# Patient Record
Sex: Male | Born: 1965 | Race: White | Hispanic: No | Smoking: Current some day smoker
Health system: Southern US, Community
[De-identification: ages and names within clinical notes are randomized; demographics above are authoritative.]

## PROBLEM LIST (undated history)

## (undated) DIAGNOSIS — K219 Gastro-esophageal reflux disease without esophagitis: Secondary | ICD-10-CM

## (undated) DIAGNOSIS — K449 Diaphragmatic hernia without obstruction or gangrene: Secondary | ICD-10-CM

## (undated) DIAGNOSIS — F1010 Alcohol abuse, uncomplicated: Secondary | ICD-10-CM

## (undated) DIAGNOSIS — F19939 Other psychoactive substance use, unspecified with withdrawal, unspecified: Secondary | ICD-10-CM

## (undated) HISTORY — PX: RHINOPLASTY FOR CLEFT LIP / PALATE: SUR1285

---

## 2013-07-27 ENCOUNTER — Encounter (HOSPITAL_BASED_OUTPATIENT_CLINIC_OR_DEPARTMENT_OTHER): Payer: Self-pay | Admitting: Emergency Medicine

## 2013-07-27 ENCOUNTER — Emergency Department (HOSPITAL_COMMUNITY): Payer: BC Managed Care – PPO

## 2013-07-27 ENCOUNTER — Emergency Department (HOSPITAL_BASED_OUTPATIENT_CLINIC_OR_DEPARTMENT_OTHER)
Admission: EM | Admit: 2013-07-27 | Discharge: 2013-07-27 | Disposition: A | Discharge status: Home / Self Care | Payer: BC Managed Care – PPO | Attending: Emergency Medicine | Admitting: Emergency Medicine

## 2013-07-27 DIAGNOSIS — F17200 Nicotine dependence, unspecified, uncomplicated: Secondary | ICD-10-CM | POA: Insufficient documentation

## 2013-07-27 DIAGNOSIS — K449 Diaphragmatic hernia without obstruction or gangrene: Secondary | ICD-10-CM | POA: Insufficient documentation

## 2013-07-27 DIAGNOSIS — K7689 Other specified diseases of liver: Secondary | ICD-10-CM | POA: Insufficient documentation

## 2013-07-27 DIAGNOSIS — K219 Gastro-esophageal reflux disease without esophagitis: Secondary | ICD-10-CM | POA: Insufficient documentation

## 2013-07-27 DIAGNOSIS — R1011 Right upper quadrant pain: Secondary | ICD-10-CM

## 2013-07-27 HISTORY — DX: Gastro-esophageal reflux disease without esophagitis: K21.9

## 2013-07-27 HISTORY — DX: Diaphragmatic hernia without obstruction or gangrene: K44.9

## 2013-07-27 LAB — CBC
Platelets: 199 10*3/uL (ref 150–400)
RBC: 4.61 MIL/uL (ref 4.22–5.81)
RDW: 13.4 % (ref 11.5–15.5)
WBC: 6 10*3/uL (ref 4.0–10.5)

## 2013-07-27 LAB — COMPREHENSIVE METABOLIC PANEL
ALT: 99 U/L — ABNORMAL HIGH (ref 0–53)
AST: 69 U/L — ABNORMAL HIGH (ref 0–37)
Albumin: 4.1 g/dL (ref 3.5–5.2)
Alkaline Phosphatase: 67 U/L (ref 39–117)
Chloride: 98 mEq/L (ref 96–112)
GFR calc Af Amer: >90 mL/min (ref >90)
Glucose, Bld: 111 mg/dL — ABNORMAL HIGH (ref 70–99)
Potassium: 4 mEq/L (ref 3.5–5.1)
Total Bilirubin: 0.7 mg/dL (ref 0.3–1.2)

## 2013-07-27 LAB — URINALYSIS, ROUTINE W REFLEX MICROSCOPIC
Bilirubin Urine: NEGATIVE
Glucose, UA: NEGATIVE mg/dL
Hgb urine dipstick: NEGATIVE
Ketones, ur: 15 mg/dL — AB
Leukocytes, UA: NEGATIVE
Protein, ur: NEGATIVE mg/dL
pH: 6 (ref 5.0–8.0)

## 2013-07-27 MED ORDER — OXYCODONE HCL 5 MG PO TABS
5.0000 mg | ORAL_TABLET | Freq: Once | ORAL | Status: AC
  Administered 2013-07-27: 5 mg via ORAL
  Filled 2013-07-27: qty 1

## 2013-07-27 MED ORDER — GI COCKTAIL ~~LOC~~
30.0000 mL | Freq: Once | ORAL | Status: AC
  Administered 2013-07-27: 30 mL via ORAL
  Filled 2013-07-27: qty 30

## 2013-07-27 NOTE — ED Provider Notes (Signed)
CSN: [id number]     Arrival date & time 07/27/13  1446 History  This chart was scribed for Dagmar Hait, MD by Dorothey Baseman, ED Scribe. This patient was seen in room MH10/MH10 and the patient's care was started at 3:00 PM.    Chief Complaint  Patient presents with  . Abdominal Pain   Patient is a 47 y.o. male presenting with abdominal pain. The history is provided by the patient. No language interpreter was used.  Abdominal Pain Pain location:  RUQ Pain quality: dull   Pain radiates to:  Does not radiate Pain severity:  Moderate Onset quality:  Sudden Timing:  Constant Chronicity:  New Context: alcohol use   Relieved by:  Heat and not moving Worsened by:  Deep breathing, movement and coughing Associated symptoms: no cough, no diarrhea, no fever, no hematuria, no nausea and no vomiting   Risk factors: alcohol abuse    HPI Comments: Jose Garner is a 47 y.o. male who presents to the Emergency Department complaining of a constant, dull, non-radiating pain to the RUQ of the abdomen, 6-7/10 currently, onset 2 days ago that is exacerbated with deep breathing, cough, and movement. He reports some associated abdominal bloating. He states that the pain is relieved with rest and reports applying heating pads to the area with relief. He denies any potential injury or trauma to the area. He denies fever, nausea, emesis, diarrhea, dysuria, hematuria. Patient reports a history of GERD and hiatal hernia, but states that his current symptoms do not feel similar. Patient reports that he is a current, every day drinker that is abusive in nature, but states that he has not had a drink in 2 days since the onset of symptoms and has been trying to taper his usage.   Past Medical History  Diagnosis Date  . GERD (gastroesophageal reflux disease)   . Hiatal hernia    No past surgical history on file. No family history on file. History  Substance Use Topics  . Smoking status: Not on file  .  Smokeless tobacco: Not on file  . Alcohol Use: Not on file    Review of Systems  Constitutional: Negative for fever.  Respiratory: Negative for cough.   Gastrointestinal: Positive for abdominal pain and abdominal distention. Negative for nausea, vomiting and diarrhea.  Genitourinary: Negative for hematuria.  All other systems reviewed and are negative.    Allergies  Review of patient's allergies indicates not on file.  Home Medications   Current Outpatient Rx  Name  Route  Sig  Dispense  Refill  . esomeprazole (NEXIUM) 40 MG capsule   Oral   Take 40 mg by mouth daily at 12 noon.         Marland Kitchen ibuprofen (ADVIL,MOTRIN) 200 MG tablet   Oral   Take 200 mg by mouth every 6 (six) hours as needed.         . propranolol (INDERAL) 10 MG tablet   Oral   Take 10 mg by mouth 3 (three) times daily.         . ranitidine (ZANTAC) 150 MG tablet   Oral   Take 150 mg by mouth 2 (two) times daily.          Triage Vitals: BP 155/107  Pulse 82  Temp(Src) 98.8 F (37.1 C)  Resp 16  Ht 5\' 8"  (1.727 m)  Wt 208 lb (94.348 kg)  BMI 31.63 kg/m2  SpO2 100%  Physical Exam  Nursing note and vitals reviewed.  Constitutional: He is oriented to person, place, and time. He appears well-developed and well-nourished. No distress.  HENT:  Head: Normocephalic and atraumatic.  Eyes: Conjunctivae are normal.  Neck: Normal range of motion. Neck supple.  Cardiovascular: Normal rate, regular rhythm and normal heart sounds.  Exam reveals no gallop and no friction rub.   No murmur heard. Pulmonary/Chest: Effort normal and breath sounds normal. No respiratory distress. He has no wheezes. He has no rales.  Abdominal: Soft. He exhibits no distension. There is tenderness. There is no rebound and no guarding.  Tenderness to palpation to the RUQ.   Genitourinary:  No CVA tenderness.   Musculoskeletal: Normal range of motion.  Neurological: He is alert and oriented to person, place, and time.  Skin:  Skin is warm and dry.  Psychiatric: He has a normal mood and affect. His behavior is normal.    ED Course  Procedures (including critical care time)  DIAGNOSTIC STUDIES: Oxygen Saturation is 100% on room air, normal by my interpretation.    COORDINATION OF CARE: 3:08 PM- Will order blood labs, UA, and a GI cocktail. Discussed treatment plan with patient at bedside and patient verbalized agreement.   4:13 PM- Patient reports that he is still feeling some discomfort after receiving the GI cocktail. Discussed that lab results indicate a slight elevation in liver enzymes, but that it is not cause for emergent concern at this time. Discussed that the patient may need to be transported to [facility] to receive an ultrasound to rule out possible issues with gallbladder. Patient reports that he will transport himself to [facility] today. Advised patient to return to the ED if there are any new or worsening symptoms, especially fever, nausea, or vomiting. Discussed treatment plan with patient at bedside and patient verbalized agreement.    Labs Review Labs Reviewed  COMPREHENSIVE METABOLIC PANEL - Abnormal; Notable for the following:    Glucose, Bld 111 (*)    AST 69 (*)    ALT 99 (*)    All other components within normal limits  URINALYSIS, ROUTINE W REFLEX MICROSCOPIC - Abnormal; Notable for the following:    Ketones, ur 15 (*)    All other components within normal limits  CBC  LIPASE, BLOOD   Imaging Review US Abdomen Complete  07/27/2013   CLINICAL DATA:  Right upper quadrant abdominal pain. Elevated liver function tests. Alcohol abuse.  EXAM: ULTRASOUND ABDOMEN COMPLETE  COMPARISON:  None.  FINDINGS: Gallbladder:  No gallstones or wall thickening visualized. No sonographic Murphy sign noted.  Common bile duct:  Diameter: 4  Liver:  Increased in echogenicity.  IVC:  No abnormality visualized.  Pancreas:  Poorly visualized due to overlying bowel gas.  Spleen:  Size and appearance within  normal limits.  Right Kidney:  Length: 11.2 cm. Echogenicity within normal limits. No mass or hydronephrosis visualized.  Left Kidney:  Length: 11.5 cm. Echogenicity within normal limits. No mass or hydronephrosis visualized.  Abdominal aorta:  No aneurysm visualized.  Other findings:  None.  IMPRESSION: No acute process or explanation for abdominal pain.  Hepatic steatosis.   Electronically Signed   By: Jeronimo Greaves M.D.   On: 07/27/2013 19:50    EKG Interpretation   None       MDM   1. Transaminitis   2. RUQ pain    47 year old male here with right upper quadrant pain. Began 2 days ago. His been constant since then. Began while he is using alcohol. Denies any alleviating  factors. Denies any fever, nausea, vomiting. No change with eating. This is worse with movement. He states he feels like his muscles are sore from doing a lot of sit-ups. Here patient's vitals are stable. He has mild right upper quadrant tenderness without guarding or rebound. His other abdominal pain. He has no CVA tenderness. His history does not match up with cholecystitis or biliary colic. With heavy alcohol use, concern for possible liver or pancreatic injury. We will check labs and give GI cocktail. Labs show mild transaminitis. Patient sent to [facility] for RUQ [place].  I personally performed the services described in this documentation, which was scribed in my presence. The recorded information has been reviewed and is accurate.      Dagmar Hait, MD 07/27/13 [phone]

## 2013-07-27 NOTE — ED Notes (Signed)
RUQ abdominal pain x 2 days.  No N/V.  No known fever.  Pt states he feels bloated.

## 2013-07-27 NOTE — ED Notes (Signed)
Pt was sent here from [facility] for an ultrasound of RUQ.  Elevated ast/alt's.  Pt c/o RUQ pain x 2 days.  States he is a heavy drinker and held off the etoh for 2 days with no pain relief.

## 2013-07-27 NOTE — ED Provider Notes (Signed)
CSN: [id number]     Arrival date & time 07/27/13  1446 History   First MD Initiated Contact with Jose Garner 07/27/13 1454     Chief Complaint  Jose Garner presents with  . Abdominal Pain    sent from [facility]   (Consider location/radiation/quality/duration/timing/severity/associated sxs/prior Treatment) Jose Garner is a 47 y.o. male presenting with abdominal pain. The history is provided by the Jose Garner.  Abdominal Pain Associated symptoms: no chest pain, no diarrhea, no nausea, no shortness of breath and no vomiting    Jose Garner was sent from [facility] for an ultrasound for evaluation right upper quadrant pain. He has had abdominal pain that is dull for the last 2-3 days. Began after eating. He is a heavy drinker. He is not interested in quitting. No nausea or vomiting. He's had some chronic diarrhea. No fevers. No decreased appetite. He has had some weight gain.  Past Medical History  Diagnosis Date  . GERD (gastroesophageal reflux disease)   . Hiatal hernia    Past Surgical History  Procedure Laterality Date  . Rhinoplasty for cleft lip / palate     History reviewed. No pertinent family history. History  Substance Use Topics  . Smoking status: Current Some Day Smoker    Types: Cigarettes  . Smokeless tobacco: Not on file  . Alcohol Use: Yes     Comment: Pt drinks heavily, 1.7L bottle of vodka, 1/5 of gin, 42 beers, 7 bottles of wine    Review of Systems  Constitutional: Negative for activity change and appetite change.  Eyes: Negative for pain.  Respiratory: Negative for chest tightness and shortness of breath.   Cardiovascular: Negative for chest pain and leg swelling.  Gastrointestinal: Positive for abdominal pain. Negative for nausea, vomiting and diarrhea.  Genitourinary: Negative for flank pain.  Musculoskeletal: Negative for back pain and neck stiffness.  Skin: Negative for rash.  Neurological: Negative for weakness, numbness and headaches.   Psychiatric/Behavioral: Positive for dysphoric mood. Negative for behavioral problems.    Allergies  Review of Jose Garner's allergies indicates no known allergies.  Home Medications   Current Outpatient Rx  Name  Route  Sig  Dispense  Refill  . Calcium Polycarbophil (EQUALACTIN) 625 MG CHEW   Oral   Chew 625 mg by mouth daily as needed (for indigestion).         . cetirizine (ZYRTEC) 10 MG tablet   Oral   Take 10 mg by mouth daily as needed for allergies.         Marland Kitchen esomeprazole (NEXIUM) 40 MG capsule   Oral   Take 40 mg by mouth daily at 12 noon.         . naproxen sodium (ANAPROX) 220 MG tablet   Oral   Take 440 mg by mouth 2 (two) times daily as needed (for headache/pain).         . propranolol (INDERAL) 10 MG tablet   Oral   Take 10 mg by mouth every Monday, Wednesday, and Friday.          . ranitidine (ZANTAC) 150 MG tablet   Oral   Take 150 mg by mouth 2 (two) times daily as needed (for gerd).           BP 136/104  Pulse 71  Temp(Src) 98.6 F (37 C) (Oral)  Resp 18  Ht 5\' 8"  (1.727 m)  Wt 206 lb (93.441 kg)  BMI 31.33 kg/m2  SpO2 97% Physical Exam  Nursing note and vitals  reviewed. Constitutional: He is oriented to person, place, and time. He appears well-developed and well-nourished.  HENT:  Head: Normocephalic and atraumatic.  Previous cleft lip and cleft palate repair  Eyes: EOM are normal. Pupils are equal, round, and reactive to light.  Neck: Normal range of motion. Neck supple.  Cardiovascular: Normal rate, regular rhythm and normal heart sounds.   No murmur heard. Pulmonary/Chest: Effort normal and breath sounds normal.  Abdominal: Soft. Bowel sounds are normal. He exhibits no distension and no mass. There is tenderness. There is no rebound and no guarding.  Mild right upper quadrant tenderness without rebound or guarding.  Musculoskeletal: Normal range of motion. He exhibits no edema.  Neurological: He is alert and oriented to person,  place, and time. No cranial nerve deficit.  Skin: Skin is warm and dry.  Psychiatric: He has a normal mood and affect.    ED Course  Procedures (including critical care time) Labs Review Labs Reviewed  COMPREHENSIVE METABOLIC PANEL - Abnormal; Notable for the following:    Glucose, Bld 111 (*)    AST 69 (*)    ALT 99 (*)    All other components within normal limits  URINALYSIS, ROUTINE W REFLEX MICROSCOPIC - Abnormal; Notable for the following:    Ketones, ur 15 (*)    All other components within normal limits  CBC  LIPASE, BLOOD   Imaging Review US Abdomen Complete  07/27/2013   CLINICAL DATA:  Right upper quadrant abdominal pain. Elevated liver function tests. Alcohol abuse.  EXAM: ULTRASOUND ABDOMEN COMPLETE  COMPARISON:  None.  FINDINGS: Gallbladder:  No gallstones or wall thickening visualized. No sonographic Murphy sign noted.  Common bile duct:  Diameter: 4  Liver:  Increased in echogenicity.  IVC:  No abnormality visualized.  Pancreas:  Poorly visualized due to overlying bowel gas.  Spleen:  Size and appearance within normal limits.  Right Kidney:  Length: 11.2 cm. Echogenicity within normal limits. No mass or hydronephrosis visualized.  Left Kidney:  Length: 11.5 cm. Echogenicity within normal limits. No mass or hydronephrosis visualized.  Abdominal aorta:  No aneurysm visualized.  Other findings:  None.  IMPRESSION: No acute process or explanation for abdominal pain.  Hepatic steatosis.   Electronically Signed   By: Jeronimo Greaves M.D.   On: 07/27/2013 19:50    EKG Interpretation   None       MDM   1. Transaminitis   2. RUQ pain    Jose Garner with upper abdominal pain. Laboratory shows mild transaminitis. Ultrasound is reassuring. White count is reassuring. Will discharge followup with GI. Jose Garner is a heavy drinker but is not ready to quit at this time. He has not drank in 2 days though.  There is  Juliet Rude. Rubin Payor, MD 07/27/13 2126

## 2014-12-02 IMAGING — US US ABDOMEN COMPLETE
1 series · 14 of 25 positions shown · non-contrast
Comparison: None.

CLINICAL DATA: Right upper quadrant abdominal pain. Elevated liver
function tests. Alcohol abuse.

EXAM:
ULTRASOUND ABDOMEN COMPLETE

[Series 1: us abdomen complete · 0.27mm/px · 14 of 73 slices shown]
[im 1/73]
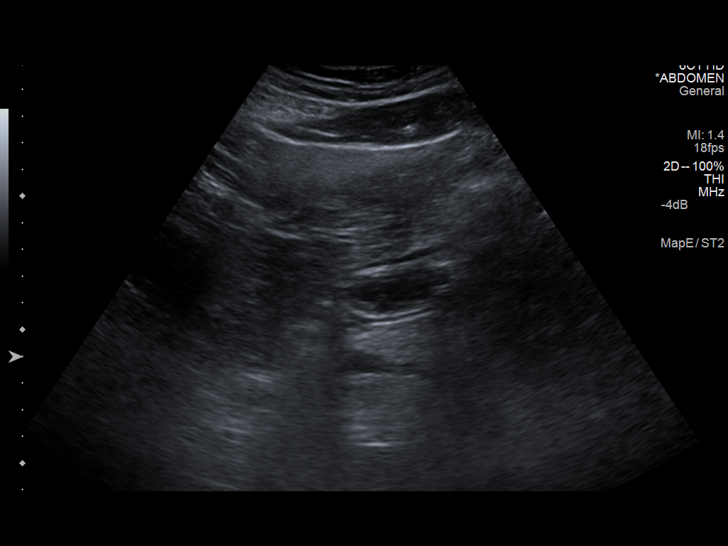
[im 7/73]
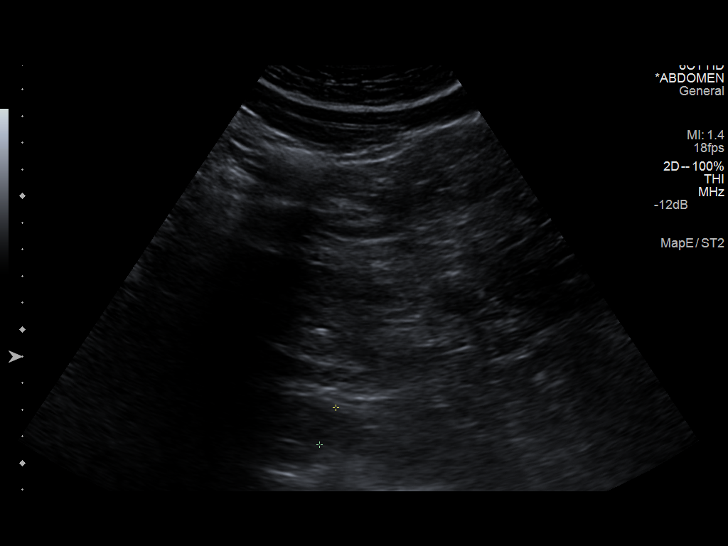
[im 13/73]
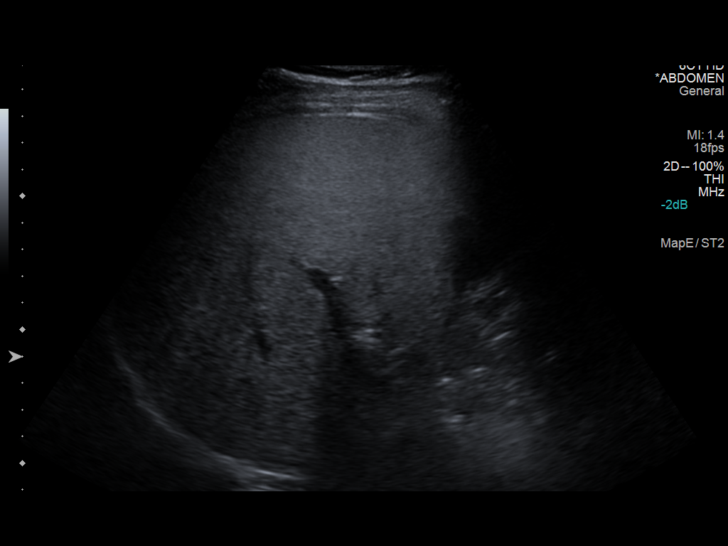
[im 19/73]
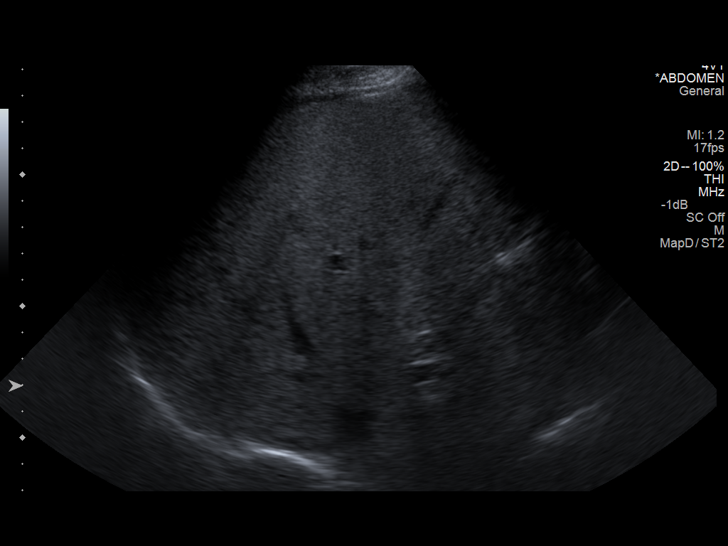
[im 25/73]
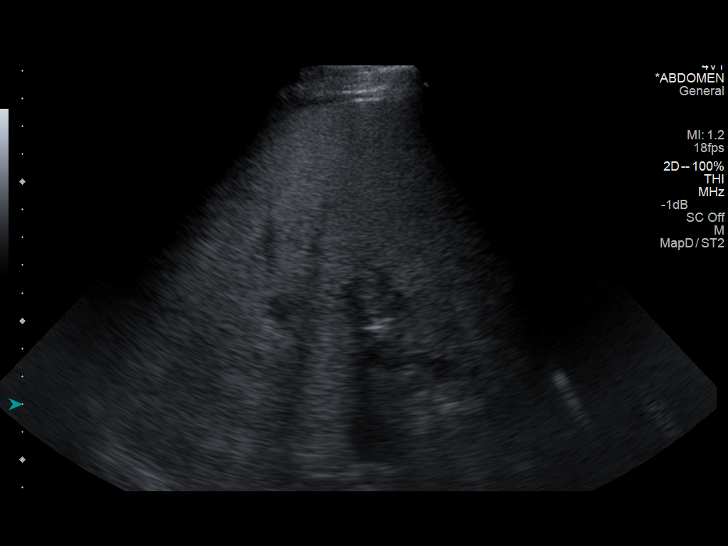
[im 28/73]
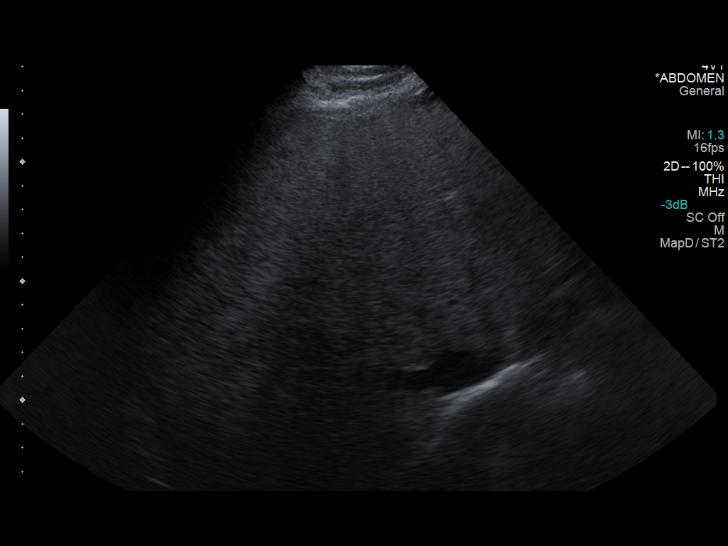
[im 34/73]
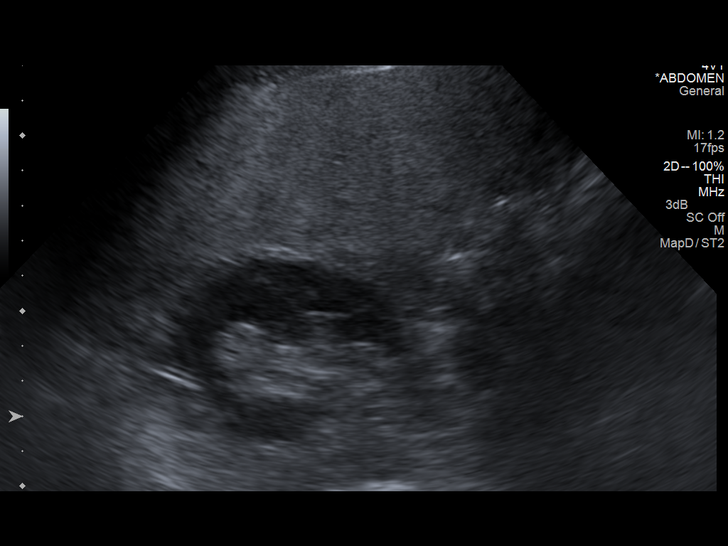
[im 40/73]
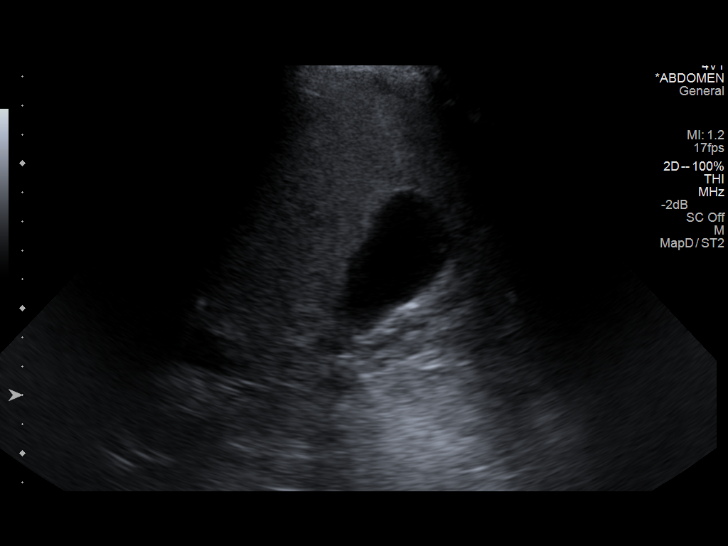
[im 46/73]
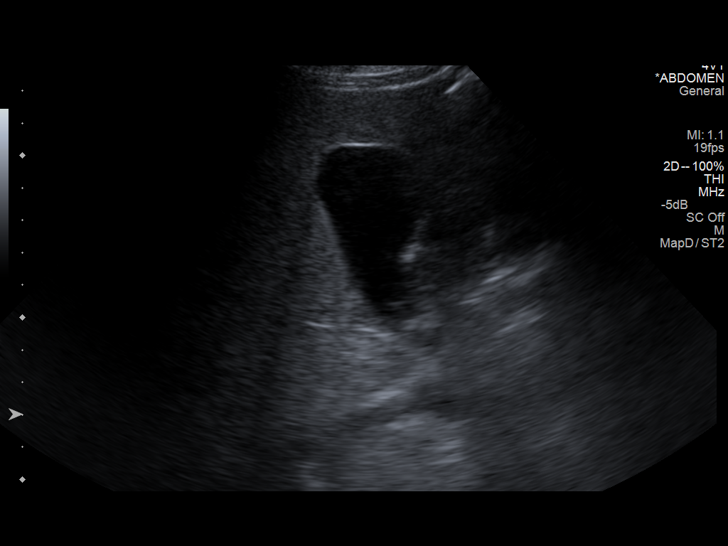
[im 49/73]
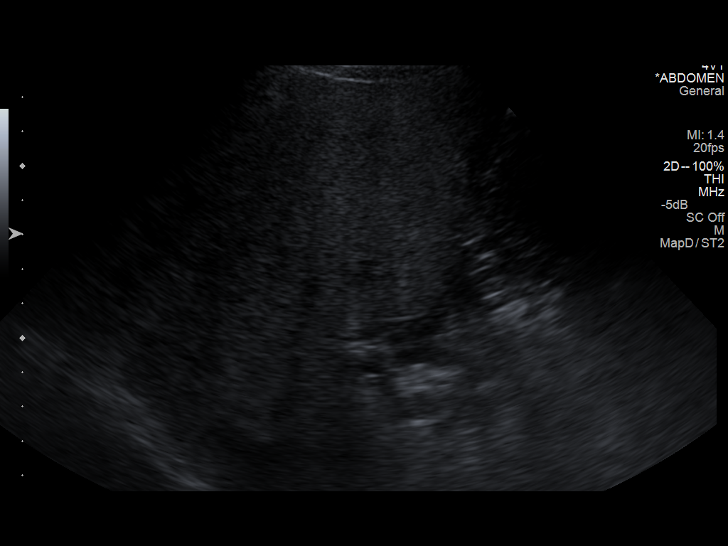
[im 55/73]
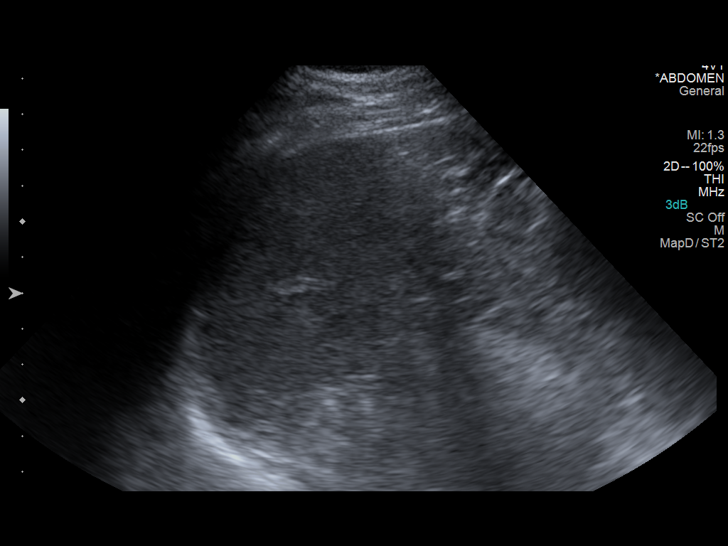
[im 61/73]
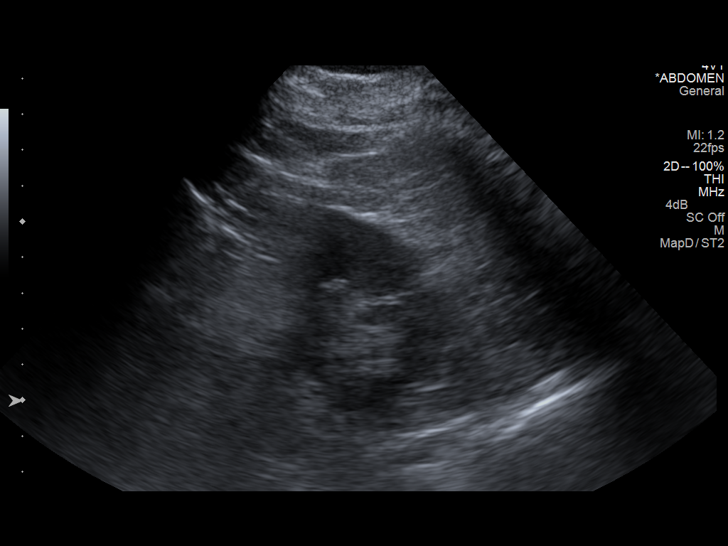
[im 67/73]
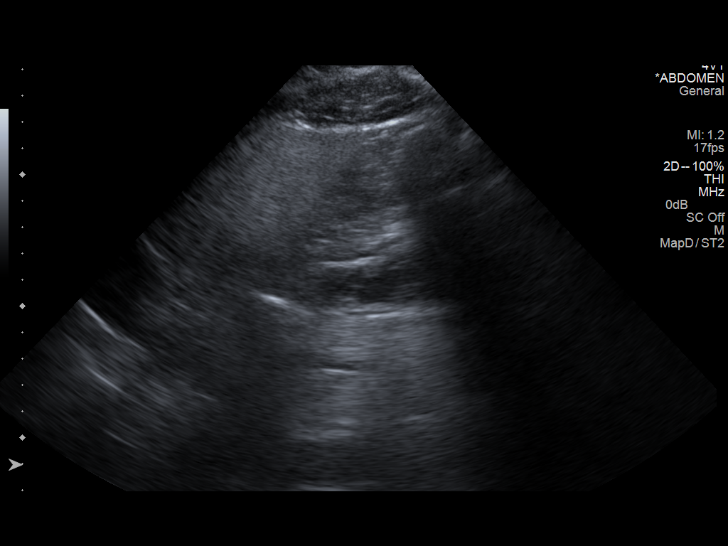
[im 73/73]
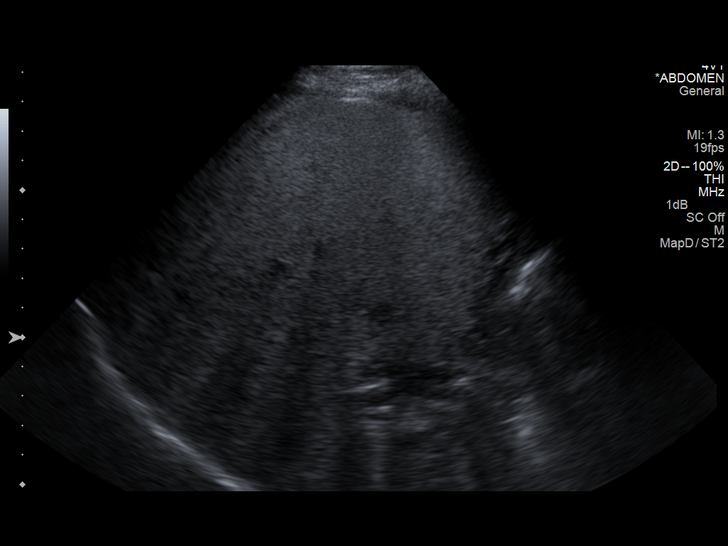

[14 of 25 positions shown; findings below may reference images not displayed]

FINDINGS: Gallbladder:

No gallstones or wall thickening visualized. No sonographic Murphy
sign noted.

Common bile duct:

Diameter: 4

Liver:

Increased in echogenicity.

IVC:

No abnormality visualized.

Pancreas:

Poorly visualized due to overlying bowel gas.

Spleen:

Size and appearance within normal limits.

Right Kidney:

Length: 11.2 cm. Echogenicity within normal limits. No mass or
hydronephrosis visualized.

Left Kidney:

Length: 11.5 cm. Echogenicity within normal limits. No mass or
hydronephrosis visualized.

Abdominal aorta:

No aneurysm visualized.

Other findings:

None.
IMPRESSION: No acute process or explanation for abdominal pain.

Hepatic steatosis.

## 2019-10-30 ENCOUNTER — Ambulatory Visit: Payer: BC Managed Care – PPO | Attending: Internal Medicine

## 2019-10-30 DIAGNOSIS — Z23 Encounter for immunization: Secondary | ICD-10-CM

## 2019-10-30 NOTE — Progress Notes (Signed)
   Covid-19 Vaccination Clinic  Name:  Jasan Doughtie    MRN: [id number] DOB: Aug 27, 1966  10/30/2019  Mr. Blowe was observed post Covid-19 immunization for 15 minutes without incident. He was provided with Vaccine Information Sheet and instruction to access the V-Safe system.   Mr. Douty was instructed to call 911 with any severe reactions post vaccine: Marland Kitchen Difficulty breathing  . Swelling of face and throat  . A fast heartbeat  . A bad rash all over body  . Dizziness and weakness   Immunizations Administered    Name Date Dose VIS Date Route   Pfizer COVID-19 Vaccine 10/30/2019  3:43 PM 0.3 mL 08/08/2019 Intramuscular   Manufacturer: [employer], [employer]   Lot: [id number]   NDC: [address]-1

## 2019-11-26 ENCOUNTER — Ambulatory Visit: Payer: BC Managed Care – PPO

## 2019-12-03 ENCOUNTER — Ambulatory Visit: Payer: BC Managed Care – PPO | Attending: Internal Medicine

## 2019-12-03 DIAGNOSIS — Z23 Encounter for immunization: Secondary | ICD-10-CM

## 2019-12-03 NOTE — Progress Notes (Signed)
   Covid-19 Vaccination Clinic  Name:  Jose Garner    MRN: [id number] DOB: May 03, 1966  12/03/2019  [name] was observed post Covid-19 immunization for 15 minutes without incident. He was provided with Vaccine Information Sheet and instruction to access the V-Safe system.   [name] was instructed to call 911 with any severe reactions post vaccine: Marland Kitchen Difficulty breathing  . Swelling of face and throat  . A fast heartbeat  . A bad rash all over body  . Dizziness and weakness   Immunizations Administered    Name Date Dose VIS Date Route   Pfizer COVID-19 Vaccine 12/03/2019  2:57 PM 0.3 mL 08/08/2019 Intramuscular   Manufacturer: [employer], [employer]   Lot: [id number]   NDC: [address]-8

## 2022-04-12 ENCOUNTER — Encounter (HOSPITAL_BASED_OUTPATIENT_CLINIC_OR_DEPARTMENT_OTHER): Payer: Self-pay | Admitting: Emergency Medicine

## 2022-04-12 ENCOUNTER — Emergency Department (HOSPITAL_BASED_OUTPATIENT_CLINIC_OR_DEPARTMENT_OTHER)
Admission: EM | Admit: 2022-04-12 | Discharge: 2022-04-12 | Disposition: A | Discharge status: Home / Self Care | Payer: BC Managed Care – PPO | Attending: Emergency Medicine | Admitting: Emergency Medicine

## 2022-04-12 ENCOUNTER — Other Ambulatory Visit: Payer: Self-pay

## 2022-04-12 DIAGNOSIS — R55 Syncope and collapse: Secondary | ICD-10-CM | POA: Diagnosis present

## 2022-04-12 DIAGNOSIS — F10230 Alcohol dependence with withdrawal, uncomplicated: Secondary | ICD-10-CM | POA: Diagnosis not present

## 2022-04-12 LAB — CBC WITH DIFFERENTIAL/PLATELET
Abs Immature Granulocytes: 0.04 10*3/uL (ref 0.00–0.07)
Basophils Absolute: 0 10*3/uL (ref 0.0–0.1)
Basophils Relative: 0 %
Eosinophils Absolute: 0.1 10*3/uL (ref 0.0–0.5)
Eosinophils Relative: 2 %
HCT: 45.3 % (ref 39.0–52.0)
Hemoglobin: 15.5 g/dL (ref 13.0–17.0)
Immature Granulocytes: 1 %
Lymphocytes Relative: 23 %
Lymphs Abs: 1.6 10*3/uL (ref 0.7–4.0)
MCH: 31.7 pg (ref 26.0–34.0)
MCHC: 34.2 g/dL (ref 30.0–36.0)
MCV: 92.6 fL (ref 80.0–100.0)
Monocytes Absolute: 0.7 10*3/uL (ref 0.1–1.0)
Monocytes Relative: 10 %
Neutro Abs: 4.4 10*3/uL (ref 1.7–7.7)
Neutrophils Relative %: 64 %
Platelets: 111 10*3/uL — ABNORMAL LOW (ref 150–400)
RBC: 4.89 MIL/uL (ref 4.22–5.81)
RDW: 15.5 % (ref 11.5–15.5)
WBC: 6.9 10*3/uL (ref 4.0–10.5)
nRBC: 0 % (ref 0.0–0.2)

## 2022-04-12 LAB — PROTIME-INR
INR: 0.9 (ref 0.8–1.2)
Prothrombin Time: 12.5 seconds (ref 11.4–15.2)

## 2022-04-12 LAB — COMPREHENSIVE METABOLIC PANEL
ALT: 56 U/L — ABNORMAL HIGH (ref 0–44)
AST: 73 U/L — ABNORMAL HIGH (ref 15–41)
Albumin: 4.5 g/dL (ref 3.5–5.0)
Alkaline Phosphatase: 60 U/L (ref 38–126)
Anion gap: 14 (ref 5–15)
BUN: 9 mg/dL (ref 6–20)
CO2: 22 mmol/L (ref 22–32)
Calcium: 9 mg/dL (ref 8.9–10.3)
Chloride: 98 mmol/L (ref 98–111)
Creatinine, Ser: 0.94 mg/dL (ref 0.61–1.24)
GFR, Estimated: >60 mL/min (ref >60)
Glucose, Bld: 136 mg/dL — ABNORMAL HIGH (ref 70–99)
Potassium: 3.2 mmol/L — ABNORMAL LOW (ref 3.5–5.1)
Sodium: 134 mmol/L — ABNORMAL LOW (ref 135–145)
Total Bilirubin: 1.3 mg/dL — ABNORMAL HIGH (ref 0.3–1.2)
Total Protein: 8.1 g/dL (ref 6.5–8.1)

## 2022-04-12 MED ORDER — CHLORDIAZEPOXIDE HCL 25 MG PO CAPS
25.0000 mg | ORAL_CAPSULE | Freq: Once | ORAL | Status: AC
  Administered 2022-04-12: 25 mg via ORAL
  Filled 2022-04-12: qty 1

## 2022-04-12 MED ORDER — LORAZEPAM 2 MG/ML IJ SOLN
2.0000 mg | Freq: Once | INTRAMUSCULAR | Status: AC
  Administered 2022-04-12: 2 mg via INTRAVENOUS
  Filled 2022-04-12: qty 1

## 2022-04-12 MED ORDER — CHLORDIAZEPOXIDE HCL 25 MG PO CAPS: ORAL_CAPSULE | ORAL | 0 refills | Status: DC

## 2022-04-12 MED ORDER — SODIUM CHLORIDE 0.9 % IV BOLUS
1000.0000 mL | Freq: Once | INTRAVENOUS | Status: AC
  Administered 2022-04-12: 1000 mL via INTRAVENOUS

## 2022-04-12 MED ORDER — THIAMINE HCL 100 MG/ML IJ SOLN
100.0000 mg | Freq: Once | INTRAMUSCULAR | Status: AC
  Administered 2022-04-12: 100 mg via INTRAVENOUS
  Filled 2022-04-12: qty 2

## 2022-04-12 NOTE — ED Triage Notes (Signed)
Called out to waiting room to check on pt.  Pt pale, diaphoretic, shaky, slurring speech.  According to wife, pt is an alcoholic and hasn't had a regular drink in 3 days.

## 2022-04-12 NOTE — Discharge Instructions (Addendum)
Use the Librium as prescribed.  If you start feeling worse and more shaky then you may need to have another drink.  I provided information for you to try and follow-up with people that can help you come off of alcohol.  Certainly you can look at your insurance card and see who is in your network as well.  Or you could ask your family doctor.

## 2022-04-12 NOTE — ED Provider Notes (Signed)
[facility] EMERGENCY DEPARTMENT Provider Note   CSN: [id number] Arrival date & time: 04/12/22  1636     History  Chief Complaint  Patient presents with   Near Syncope    Jose Garner is a 56 y.o. male.  56 yo M with a chief complaints of a syncopal event.  Patient has had multiple of these today.  He has been feeling very unwell and has felt a bit shaky.  The patient has had dependence with alcohol in the past.  Typically he self titrates him down slowly but this time he started stopping his drinking quicker so that he can go back to work.  He denies seizure-like activity.  Tells me that he feels like the world is closing in on him gets very sweaty and sit down or lay down.  He denies cough congestion or fever.  Denies abdominal pain.  Denies chest pain or difficulty breathing.   Near Syncope       Home Medications Prior to Admission medications   Medication Sig Start Date End Date Taking? Authorizing Provider  chlordiazePOXIDE (LIBRIUM) 25 MG capsule 50mg  PO TID x 1D, then 25-50mg  PO BID X 1D, then 25-50mg  PO QD X 1D 04/12/22  Yes Deno Etienne, DO  Calcium Polycarbophil (EQUALACTIN) 625 MG CHEW Chew 625 mg by mouth daily as needed (for indigestion).    [provider]  cetirizine (ZYRTEC) 10 MG tablet Take 10 mg by mouth daily as needed for allergies.    [provider]  esomeprazole (NEXIUM) 40 MG capsule Take 40 mg by mouth daily at 12 noon.    [provider]  naproxen sodium (ANAPROX) 220 MG tablet Take 440 mg by mouth 2 (two) times daily as needed (for headache/pain).    [provider]  propranolol (INDERAL) 10 MG tablet Take 10 mg by mouth every Monday, Wednesday, and Friday.     [provider]  ranitidine (ZANTAC) 150 MG tablet Take 150 mg by mouth 2 (two) times daily as needed (for gerd).     [provider]      Allergies    Patient has no known allergies.    Review of Systems   Review of Systems   Cardiovascular:  Positive for near-syncope.    Physical Exam Updated Vital Signs BP (!) 148/104   Pulse 75   Temp 98.5 F (36.9 C) (Oral)   Resp 18   Ht 5\' 8"  (1.727 m)   Wt 83.9 kg   SpO2 98%   BMI 28.13 kg/m  Physical Exam Vitals and nursing note reviewed.  Constitutional:      Appearance: He is well-developed.  HENT:     Head: Normocephalic and atraumatic.  Eyes:     Pupils: Pupils are equal, round, and reactive to light.  Neck:     Vascular: No JVD.  Cardiovascular:     Rate and Rhythm: Normal rate and regular rhythm.     Heart sounds: No murmur heard.    No friction rub. No gallop.  Pulmonary:     Effort: No respiratory distress.     Breath sounds: No wheezing.  Abdominal:     General: There is no distension.     Tenderness: There is no abdominal tenderness. There is no guarding or rebound.  Musculoskeletal:        General: Normal range of motion.     Cervical back: Normal range of motion and neck supple.  Skin:    Coloration: Skin is  not pale.     Findings: No rash.  Neurological:     Mental Status: He is alert and oriented to person, place, and time.  Psychiatric:        Behavior: Behavior normal.     ED Results / Procedures / Treatments   Labs (all labs ordered are listed, but only abnormal results are displayed) Labs Reviewed  CBC WITH DIFFERENTIAL/PLATELET - Abnormal; Notable for the following components:      Result Value   Platelets 111 (*)    All other components within normal limits  COMPREHENSIVE METABOLIC PANEL - Abnormal; Notable for the following components:   Sodium 134 (*)    Potassium 3.2 (*)    Glucose, Bld 136 (*)    AST 73 (*)    ALT 56 (*)    Total Bilirubin 1.3 (*)    All other components within normal limits  PROTIME-INR  URINALYSIS, ROUTINE W REFLEX MICROSCOPIC    EKG EKG Interpretation  Date/Time:  Wednesday April 12 2022 17:00:49 EDT Ventricular Rate:  71 PR Interval:  147 QRS Duration: 92 QT  Interval:  410 QTC Calculation: 446 R Axis:   -55 Text Interpretation: Sinus rhythm LAD, consider left anterior fascicular block Minimal ST elevation, anterior leads no wpw, prolonged qt or brugada No old tracing to compare Confirmed by Melene Plan [phone]) on 04/12/2022 5:20:49 PM  Radiology No results found.  Procedures Procedures    Medications Ordered in ED Medications  sodium chloride 0.9 % bolus 1,000 mL (0 mLs Intravenous Stopped 04/12/22 1902)  LORazepam (ATIVAN) injection 2 mg (2 mg Intravenous Given 04/12/22 1737)  chlordiazePOXIDE (LIBRIUM) capsule 25 mg (25 mg Oral Given 04/12/22 1738)  thiamine (VITAMIN B1) injection 100 mg (100 mg Intravenous Given 04/12/22 1737)    ED Course/ Medical Decision Making/ A&P                           Medical Decision Making Amount and/or Complexity of Data Reviewed Labs: ordered.  Risk Prescription drug management.   56 yo M with a chief complaints of multiple syncopal events in the context of trying to stop drinking as much alcohol.  He tells me that he has been drinking 3 bottles of wine a night and has been trying to decrease his intake simply go back to work.  He had what sounds of a syncopal event in the waiting room and was brought immediately back.  Upon making it back to the room the patient looks pale, but otherwise he is maintaining his blood pressure.  We will give a bolus of IV fluids.  Ativan.  Librium.  Blood work.  Reassess.  Patient feeling much better on reassessment.  Ambulates without issue.  Wants to go home.  Librium taper.   No anemia, no leukocytosis, no electrolyte issue.   7:21 PM:  I have discussed the diagnosis/risks/treatment options with the patient.  Evaluation and diagnostic testing in the emergency department does not suggest an emergent condition requiring admission or immediate intervention beyond what has been performed at this time.  They will follow up with  PCP. We also discussed returning to the ED  immediately if new or worsening sx occur. We discussed the sx which are most concerning (e.g., sudden worsening pain, fever, inability to tolerate by mouth) that necessitate immediate return. Medications administered to the patient during their visit and any new prescriptions provided to the patient are listed below.  Medications given during  this visit Medications  sodium chloride 0.9 % bolus 1,000 mL (0 mLs Intravenous Stopped 04/12/22 1902)  LORazepam (ATIVAN) injection 2 mg (2 mg Intravenous Given 04/12/22 1737)  chlordiazePOXIDE (LIBRIUM) capsule 25 mg (25 mg Oral Given 04/12/22 1738)  thiamine (VITAMIN B1) injection 100 mg (100 mg Intravenous Given 04/12/22 1737)     The patient appears reasonably screen and/or stabilized for discharge and I doubt any other medical condition or other [facility] requiring further screening, evaluation, or treatment in the ED at this time prior to discharge.          Final Clinical Impression(s) / ED Diagnoses Final diagnoses:  Alcohol dependence with uncomplicated withdrawal (HCC)    Rx / DC Orders ED Discharge Orders          Ordered    chlordiazePOXIDE (LIBRIUM) 25 MG capsule        04/12/22 1808              Melene Plan, DO 04/12/22 1921

## 2022-04-12 NOTE — ED Notes (Signed)
Patient ambulated in hall per order. Patient able to walk with steady gait around nurse station. Stated that he is still shaky and "somewhat dizzy".

## 2024-01-02 ENCOUNTER — Emergency Department (HOSPITAL_BASED_OUTPATIENT_CLINIC_OR_DEPARTMENT_OTHER)

## 2024-01-02 ENCOUNTER — Encounter (HOSPITAL_BASED_OUTPATIENT_CLINIC_OR_DEPARTMENT_OTHER): Payer: Self-pay | Admitting: Emergency Medicine

## 2024-01-02 ENCOUNTER — Other Ambulatory Visit: Payer: Self-pay

## 2024-01-02 ENCOUNTER — Emergency Department (HOSPITAL_BASED_OUTPATIENT_CLINIC_OR_DEPARTMENT_OTHER)
Admission: EM | Admit: 2024-01-02 | Discharge: 2024-01-02 | Disposition: A | Discharge status: Home / Self Care | Attending: Emergency Medicine | Admitting: Emergency Medicine

## 2024-01-02 DIAGNOSIS — I10 Essential (primary) hypertension: Secondary | ICD-10-CM | POA: Diagnosis not present

## 2024-01-02 DIAGNOSIS — R1013 Epigastric pain: Secondary | ICD-10-CM | POA: Diagnosis not present

## 2024-01-02 DIAGNOSIS — R079 Chest pain, unspecified: Secondary | ICD-10-CM | POA: Diagnosis present

## 2024-01-02 DIAGNOSIS — R0789 Other chest pain: Secondary | ICD-10-CM | POA: Diagnosis not present

## 2024-01-02 DIAGNOSIS — Z79899 Other long term (current) drug therapy: Secondary | ICD-10-CM | POA: Insufficient documentation

## 2024-01-02 HISTORY — DX: Other psychoactive substance use, unspecified with withdrawal, unspecified: F19.939

## 2024-01-02 HISTORY — DX: Alcohol abuse, uncomplicated: F10.10

## 2024-01-02 LAB — TROPONIN T, HIGH SENSITIVITY
Troponin T High Sensitivity: <15 ng/L (ref <19)
Troponin T High Sensitivity: <15 ng/L (ref <19)

## 2024-01-02 LAB — CBC WITH DIFFERENTIAL/PLATELET
Abs Immature Granulocytes: 0.04 10*3/uL (ref 0.00–0.07)
Basophils Absolute: 0.1 10*3/uL (ref 0.0–0.1)
Basophils Relative: 1 %
Eosinophils Absolute: 0.2 10*3/uL (ref 0.0–0.5)
Eosinophils Relative: 2 %
HCT: 39.9 % (ref 39.0–52.0)
Hemoglobin: 13.7 g/dL (ref 13.0–17.0)
Immature Granulocytes: 1 %
Lymphocytes Relative: 19 %
Lymphs Abs: 1.4 10*3/uL (ref 0.7–4.0)
MCH: 32.9 pg (ref 26.0–34.0)
MCHC: 34.3 g/dL (ref 30.0–36.0)
MCV: 95.9 fL (ref 80.0–100.0)
Monocytes Absolute: 0.8 10*3/uL (ref 0.1–1.0)
Monocytes Relative: 11 %
Neutro Abs: 4.6 10*3/uL (ref 1.7–7.7)
Neutrophils Relative %: 66 %
Platelets: 222 10*3/uL (ref 150–400)
RBC: 4.16 MIL/uL — ABNORMAL LOW (ref 4.22–5.81)
RDW: 13.2 % (ref 11.5–15.5)
WBC: 7.1 10*3/uL (ref 4.0–10.5)
nRBC: 0 % (ref 0.0–0.2)

## 2024-01-02 LAB — LIPASE, BLOOD: Lipase: 42 U/L (ref 11–51)

## 2024-01-02 LAB — COMPREHENSIVE METABOLIC PANEL WITH GFR
ALT: 106 U/L — ABNORMAL HIGH (ref 0–44)
AST: 122 U/L — ABNORMAL HIGH (ref 15–41)
Albumin: 4.4 g/dL (ref 3.5–5.0)
Alkaline Phosphatase: 71 U/L (ref 38–126)
Anion gap: 14 (ref 5–15)
BUN: 8 mg/dL (ref 6–20)
CO2: 23 mmol/L (ref 22–32)
Calcium: 9.4 mg/dL (ref 8.9–10.3)
Chloride: 98 mmol/L (ref 98–111)
Creatinine, Ser: 0.86 mg/dL (ref 0.61–1.24)
GFR, Estimated: >60 mL/min (ref >60)
Glucose, Bld: 104 mg/dL — ABNORMAL HIGH (ref 70–99)
Potassium: 4 mmol/L (ref 3.5–5.1)
Sodium: 134 mmol/L — ABNORMAL LOW (ref 135–145)
Total Bilirubin: 0.6 mg/dL (ref 0.0–1.2)
Total Protein: 8 g/dL (ref 6.5–8.1)

## 2024-01-02 LAB — D-DIMER, QUANTITATIVE: D-Dimer, Quant: <0.27 ug{FEU}/mL (ref 0.00–0.50)

## 2024-01-02 MED ORDER — PANTOPRAZOLE SODIUM 40 MG PO TBEC: 40.0000 mg | DELAYED_RELEASE_TABLET | Freq: Every day | ORAL | 0 refills | Status: AC

## 2024-01-02 MED ORDER — CHLORDIAZEPOXIDE HCL 25 MG PO CAPS: ORAL_CAPSULE | ORAL | 0 refills | Status: AC

## 2024-01-02 NOTE — ED Notes (Signed)

## 2024-01-02 NOTE — ED Triage Notes (Signed)
 Pt reports SHOB and occasional chest tightness x 3wks; has seen PCP for same; had normal EKG there; pt is concerned because he sts he is an alcoholic; reports currently drinking up to 15 drinks per day

## 2024-01-02 NOTE — ED Notes (Signed)
 Cannot discharge, registration in chart.

## 2024-01-02 NOTE — ED Provider Notes (Signed)
 [facility] Provider Note   CSN: [id number] Arrival date & time: 01/02/24  1444     History  Chief Complaint  Patient presents with   Chest Pain    Jose Garner is a 58 y.o. male.  [name] is a 76 M with a history of hypertension, reflux, tobacco use, and significant alcohol use here for chest pain. He is accompanied by his mother, Jose Garner. Patient endorses chest pain/tightness for the past 3 weeks. States it started as shortness of breath, has been walking to work and only in the past 3 weeks has noticed that he becomes winded. Also endorses associated radiating pain to both arms, although he has a history of right rotator cuff injury. At times his chest tightness seemed to be worse after meals but is not happening with activity and rest. Pulse ox at home reads 96-97% oxygen saturation. States his legs can get swollen, especially by the end of the day. He was assessed for the same chest pain by his PCP ([facility]) and the outpatient workup was unrevealing. EKG at the time was unremarkable compared to previous EKGs. Has experienced a lot of anxiety over his chest pain as there is family history of MI and he worries it has to do with his alcohol intake. Denies past history of MI, DVT, or stroke. Has experienced alcohol withdrawal requiring medical treatment.   His medication regimen includes Losartan 50 mg daily, Propranolol 10 mg as needed for anxiety, OTC Zegerid/omeprazole, and aspirin 81 mg daily. He does take Xanax (not prescribed to him) as needed for anxiety, last dose was last night. Has a history of tobacco use, states he cut back about 3 weeks ago, does take canabis gummies occasionally. Alcohol use on a daily basis is mostly around 6-7 beers, can be as high as 15 beers. Past EGD showed hiatal hernia. Last drink was this afternoon right before coming to the ED. Ate breakfast this morning. Denies fevers, nausea/vomiting, sick  contacts, and recent illness. Has chronic diarrhea.    Chest Pain      Home Medications Prior to Admission medications   Medication Sig Start Date End Date Taking? Authorizing Provider  pantoprazole  (PROTONIX ) 40 MG tablet Take 1 tablet (40 mg total) by mouth daily. 01/02/24 02/01/24 Yes Long, Joshua G, MD  Calcium Polycarbophil (EQUALACTIN) 625 MG CHEW Chew 625 mg by mouth daily as needed (for indigestion).    [provider]  cetirizine (ZYRTEC) 10 MG tablet Take 10 mg by mouth daily as needed for allergies.    [provider]  chlordiazePOXIDE  (LIBRIUM ) 25 MG capsule 50mg  PO TID x 1D, then 25-50mg  PO BID X 1D, then 25-50mg  PO QD X 1D 01/02/24   Long, Joshua G, MD  esomeprazole (NEXIUM) 40 MG capsule Take 40 mg by mouth daily at 12 noon.    [provider]  propranolol (INDERAL) 10 MG tablet Take 10 mg by mouth every Monday, Wednesday, and Friday.     [provider]  ranitidine (ZANTAC) 150 MG tablet Take 150 mg by mouth 2 (two) times daily as needed (for gerd).     [provider]      Allergies    Patient has no known allergies.    Review of Systems   Review of Systems  Cardiovascular:  Positive for chest pain.    Physical Exam Updated Vital Signs BP 128/86   Pulse 74   Temp 97.7 F (36.5 C)  Resp 18   Ht 5\' 8"  (1.727 m)   Wt 87.1 kg   SpO2 100%   BMI 29.19 kg/m  Physical Exam HENT:     Head: Normocephalic and atraumatic.  Cardiovascular:     Heart sounds: Normal heart sounds.  Pulmonary:     Effort: Pulmonary effort is normal.     Breath sounds: Normal breath sounds.  Abdominal:     General: Bowel sounds are normal.     Palpations: Abdomen is soft.     Comments: Some epigastric tenderness.  Musculoskeletal:     Right lower leg: No edema.     Comments: Pain does not seem reproducible with palpation.   Skin:    General: Skin is warm.  Neurological:     General: No focal deficit present.     Mental Status: He is  alert and oriented to person, place, and time.  Psychiatric:        Mood and Affect: Mood normal.     ED Results / Procedures / Treatments   Labs (all labs ordered are listed, but only abnormal results are displayed) Labs Reviewed  COMPREHENSIVE METABOLIC PANEL WITH GFR - Abnormal; Notable for the following components:      Result Value   Sodium 134 (*)    Glucose, Bld 104 (*)    AST 122 (*)    ALT 106 (*)    All other components within normal limits  CBC WITH DIFFERENTIAL/PLATELET - Abnormal; Notable for the following components:   RBC 4.16 (*)    All other components within normal limits  LIPASE, BLOOD  D-DIMER, QUANTITATIVE  TROPONIN T, HIGH SENSITIVITY  TROPONIN T, HIGH SENSITIVITY    EKG EKG Interpretation Date/Time:  Wednesday Jan 02 2024 14:51:23 EDT Ventricular Rate:  86 PR Interval:  154 QRS Duration:  94 QT Interval:  358 QTC Calculation: 429 R Axis:   -66  Text Interpretation: Sinus rhythm LAD, consider left anterior fascicular block Confirmed by Abby Hocking [phone]) on 01/02/2024 3:08:47 PM  Radiology DG Chest 2 View Result Date: 01/02/2024 CLINICAL DATA:  Chest pain, shortness of breath for 2 weeks. EXAM: CHEST - 2 VIEW COMPARISON:  None available currently. FINDINGS: The heart size and mediastinal contours are within normal limits. Both lungs are clear. The visualized skeletal structures are unremarkable. IMPRESSION: No active cardiopulmonary disease. Electronically Signed   By: Rosalene Colon M.D.   On: 01/02/2024 16:21    Procedures Procedures    Medications Ordered in ED Medications - No data to display  ED Course/ Medical Decision Making/ A&P                                 Medical Decision Making Patient presents with 3 weeks of chest pain with associated pain radiating to the arms at times and subjective dyspnea with activity and at rest. He is able to provide an accurate history and is not in any acute distress. Rates chest pain as 3/10 and  declined any pain medication at this time.   Initial differential includes ACS, reflux, alcoholic gastropathy, pancreatitis, PE, anxiety, pneumonia, pericarditis, and musculoskeletal pain. EKGs performed outpatient and here have been stable compared to previous EKGs (2023). No signs of new left bundle branch block or ischemic changes. Although low suspicion of MI, will get troponin given family history and persona risk factors. Although patient endorse dyspnea, especially with exertion, it seems that it's mostly subjective  as he has been saturating 96-97% at home and 100% here on room air. Pulmonary exam unremarkable. No clotting disorders or history of DVT and PE. Well's score 0, however due to age will get D-dimer. Patient is afebrile here, denies fever at home or recent illness, and CV and pulmonary physical exams unremarkable. Low suspicion for infectious cause, but will get chest xray. Pain seems to be mostly substernal, seems deep and difficult to tell if reproducible by pain. Patient denies injury or recent trauma, making musculoskeletal etiology less likely. Pain was initially associated with meals, patient has tried OTC PPI although he has only been taking it intermittently rather than daily for symptoms. Given significant alcohol use, high suspicion that symptoms may be related to gastritis. Will order basic labs, d-dimer, lipase, chest xray, and maintain patient on telemetry for now. No concerns for alcohol withdrawal at the moment, will keep this in consideration should he require admission.   Chest xray negative for cardiac or pulmonary disease, troponin <15 then <15 on repeat, d-dimer <0.27, CBC unremarkable, CMP with AST and ALT more elevated compared to CMP one year ago, and lipase 42. Patient's vitals remained stable, blood pressure was elevated in the setting of known hypertension and pain. At this time ruled out dangerous cause of chest pain. 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 Patient advised to take pantoprazole  40 mg daily consistently for at least 4 weeks and OTC Maalox for additional relief. Counseled on alcohol use and risks/dangers of suddenly stopping his drinking. Patient acknowledged understanding that he will need medical management if he wishes to decrease alcohol use or stop completely in the future. He was agreeable to a Librium  taper, which he has taken before in the outpatient setting.   At this time patient is stable and agreeable to discharge home. He does have an upcoming appointment with cardiology, recommend following up on atypical chest pain with cardiologist in the event that symptoms persist or worsen.   Amount and/or Complexity of Data Reviewed Labs: ordered. Radiology: ordered.  Risk Prescription drug management.          Final Clinical Impression(s) / ED Diagnoses Final diagnoses:  Atypical chest pain    Rx / DC Orders ED Discharge Orders          Ordered    chlordiazePOXIDE  (LIBRIUM ) 25 MG capsule        01/02/24 1848    pantoprazole  (PROTONIX ) 40 MG tablet  Daily        01/02/24 1848              Arellano Zameza, Clotine Heiner, MD 01/02/24 1904    Long, Joshua G, MD 01/08/24 0202

## 2024-01-02 NOTE — Discharge Instructions (Addendum)
 You were seen today for chest pain. Based on today's labs and imaging it does not appear that you are experiencing a medical emergency such as a heart attack, lung infection, or clot in your lungs. Based on your history and symptoms, it seems that your symptoms could be due to gastric reflux. You are at higher risk for gastric reflux given your history of alcohol use and hiatal hernia. We recommend that you take pantoprazole 40 mg daily. You can take it 30 minutes before breakfast. You can also take maalox as needed for additional symptom relief. You can follow up with your primary care provider as needed should your symptoms stay the same or worsen. Through shared decision making, you opted to have a prescription of Librium  on hand for alcohol withdrawal. You have taken this medication in the past. We reviewed instructions together but you can also discuss with your pharmacist if you have any additional questions. We also recommend that you follow up with your cardiologist as you have risk factors for cardiovascular disease.   Please be sure to seek medical assistance should you experience sudden chest pain, chest pain that radiates down to the arm or is accompanied by weakness, sudden shortness of breath, and any vomiting that keeps you from being able to drink fluids.
